# Patient Record
Sex: Female | Born: 1995 | Race: Black or African American | Hispanic: No | Marital: Single | Smoking: Never smoker
Health system: Southern US, Community
[De-identification: ages and names within clinical notes are randomized; demographics above are authoritative.]

## PROBLEM LIST (undated history)

## (undated) DIAGNOSIS — F419 Anxiety disorder, unspecified: Secondary | ICD-10-CM

## (undated) DIAGNOSIS — E119 Type 2 diabetes mellitus without complications: Secondary | ICD-10-CM

## (undated) DIAGNOSIS — E669 Obesity, unspecified: Secondary | ICD-10-CM

## (undated) DIAGNOSIS — F909 Attention-deficit hyperactivity disorder, unspecified type: Secondary | ICD-10-CM

## (undated) DIAGNOSIS — F32A Depression, unspecified: Secondary | ICD-10-CM

## (undated) DIAGNOSIS — R7303 Prediabetes: Secondary | ICD-10-CM

## (undated) DIAGNOSIS — F5105 Insomnia due to other mental disorder: Secondary | ICD-10-CM

## (undated) HISTORY — DX: Prediabetes: R73.03

## (undated) HISTORY — DX: Obesity, unspecified: E66.9

## (undated) HISTORY — DX: Insomnia due to other mental disorder: F51.05

## (undated) HISTORY — DX: Depression, unspecified: F32.A

---
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
## 2017-07-10 ENCOUNTER — Ambulatory Visit: Payer: Self-pay | Admitting: Sports Medicine

## 2018-01-07 ENCOUNTER — Encounter (HOSPITAL_COMMUNITY): Payer: Self-pay | Admitting: Emergency Medicine

## 2018-01-07 ENCOUNTER — Emergency Department (HOSPITAL_COMMUNITY)
Admission: EM | Admit: 2018-01-07 | Discharge: 2018-01-08 | Disposition: A | Discharge status: Home / Self Care | Payer: Self-pay | Attending: Emergency Medicine | Admitting: Emergency Medicine

## 2018-01-07 ENCOUNTER — Emergency Department (HOSPITAL_COMMUNITY): Payer: Self-pay

## 2018-01-07 DIAGNOSIS — R0602 Shortness of breath: Secondary | ICD-10-CM | POA: Insufficient documentation

## 2018-01-07 DIAGNOSIS — F419 Anxiety disorder, unspecified: Secondary | ICD-10-CM | POA: Insufficient documentation

## 2018-01-07 LAB — BASIC METABOLIC PANEL
Anion gap: 7 (ref 5–15)
BUN: 11 mg/dL (ref 6–20)
CALCIUM: 9.5 mg/dL (ref 8.9–10.3)
CO2: 26 mmol/L (ref 22–32)
CREATININE: 0.58 mg/dL (ref 0.44–1.00)
Chloride: 105 mmol/L (ref 101–111)
GFR calc Af Amer: >60 mL/min (ref >60)
GFR calc non Af Amer: >60 mL/min (ref >60)
Glucose, Bld: 98 mg/dL (ref 65–99)
Potassium: 3.6 mmol/L (ref 3.5–5.1)
Sodium: 138 mmol/L (ref 135–145)

## 2018-01-07 LAB — I-STAT TROPONIN, ED: TROPONIN I, POC: 0 ng/mL (ref 0.00–0.08)

## 2018-01-07 LAB — CBC
HCT: 42.1 % (ref 36.0–46.0)
Hemoglobin: 13.9 g/dL (ref 12.0–15.0)
MCH: 28.1 pg (ref 26.0–34.0)
MCHC: 33 g/dL (ref 30.0–36.0)
MCV: 85.2 fL (ref 78.0–100.0)
Platelets: 354 10*3/uL (ref 150–400)
RBC: 4.94 MIL/uL (ref 3.87–5.11)
RDW: 13.6 % (ref 11.5–15.5)
WBC: 7.9 10*3/uL (ref 4.0–10.5)

## 2018-01-07 LAB — I-STAT BETA HCG BLOOD, ED (MC, WL, AP ONLY): I-stat hCG, quantitative: <5 m[IU]/mL (ref <5)

## 2018-01-07 NOTE — ED Notes (Signed)
Family reports patient is in the restroom.

## 2018-01-07 NOTE — Discharge Instructions (Signed)
We saw you in the ER for the chest tightness. All the results in the ER are normal, labs and imaging.  The workup in the ER is not complete, and is limited to screening for life threatening and emergent conditions only, so please see a primary care doctor for further evaluation.  Please follow-up with your primary care doctor for optimal management of your anxiety.

## 2018-01-07 NOTE — ED Triage Notes (Signed)
Patient c/o intermittent central sharp chest pain sudden onset this afternoon. Denies pain radiation, N/V. Reports hx anxiety. States "it may be related to my anxiety."

## 2018-01-08 NOTE — ED Provider Notes (Signed)
[facility]-EMERGENCY DEPT Provider Note   CSN: [id number] Arrival date & time: 01/07/18  1706     History   Chief Complaint Chief Complaint  Patient presents with  . Chest Pain  . Anxiety    HPI Claire Chase is a 22 y.o. female.  HPI 22 year old female comes in with chief complaint of chest pain and shortness of breath.  Patient reports that she has history of anxiety, and intermittently will get chest tightness and shortness of breath.  Patient reports that today she was getting ready to get to work when she started having sudden onset chest tightness along with dyspnea.  Patient denies any associated nausea, vomiting, dizziness, diaphoresis.  Her symptoms lasted longer than usual, therefore she decided to come to the ER.  At one point patient was taking 2 medications for anxiety, however she stopped them because she did not feel like they are helping her.  Pt has no hx of PE, DVT and denies any exogenous hormone (testosterone / estrogen) use, long distance travels or surgery in the past 6 weeks, active cancer, recent immobilization.  Patient also denies any history of heavy smoking or substance abuse.   Past Medical History:  Diagnosis Date  . ADHD (attention deficit hyperactivity disorder)     There are no active problems to display for this patient.   History reviewed. No pertinent surgical history.  OB History    No data available       Home Medications    Prior to Admission medications   Medication Sig Start Date End Date Taking? Authorizing Provider  naproxen (NAPROSYN) 500 MG tablet Take 1 tablet (500 mg total) by mouth 2 (two) times daily. 11/06/14   Derwood Kaplan, MD    Family History No family history on file.  Social History Social History   Tobacco Use  . Smoking status: Never Smoker  Substance Use Topics  . Alcohol use: No  . Drug use: No     Allergies   Patient has no known allergies.   Review of Systems Review  of Systems  Respiratory: Positive for chest tightness and shortness of breath.   Hematological: Does not bruise/bleed easily.     Physical Exam Updated Vital Signs BP 115/87 (BP Location: Left Arm)   Pulse 84   Temp 98.4 F (36.9 C) (Oral)   Resp 18   LMP 12/13/2017   SpO2 100%   Physical Exam  Constitutional: She is oriented to person, place, and time. She appears well-developed.  HENT:  Head: Normocephalic and atraumatic.  Eyes: EOM are normal.  Neck: Normal range of motion. Neck supple.  Cardiovascular: Normal rate.  Pulmonary/Chest: Effort normal. No respiratory distress.  Abdominal: Bowel sounds are normal.  Neurological: She is alert and oriented to person, place, and time.  Skin: Skin is warm and dry.  Nursing note and vitals reviewed.    ED Treatments / Results  Labs (all labs ordered are listed, but only abnormal results are displayed) Labs Reviewed  BASIC METABOLIC PANEL  CBC  I-STAT TROPONIN, ED  I-STAT BETA HCG BLOOD, ED ([facility], [facility], [facility] ONLY)    EKG  EKG Interpretation  Date/Time:  Monday January 07 2018 17:23:56 EST Ventricular Rate:  88 PR Interval:    QRS Duration: 95 QT Interval:  359 QTC Calculation: 435 R Axis:   43 Text Interpretation:  Sinus rhythm No acute changes No significant change since last tracing Confirmed by Derwood Kaplan [phone]) on 01/07/2018 11:04:27 PM  Radiology Dg Chest 2 View  Result Date: 01/07/2018 CLINICAL DATA:  Chest pain RIGHT upper chest with shortness of breath and dry cough EXAM: CHEST  2 VIEW COMPARISON:  None FINDINGS: Enlargement of cardiac silhouette. Mediastinal contours and pulmonary vascularity normal. Low lung volumes with mild RIGHT basilar atelectasis. Lungs otherwise clear. No acute infiltrate, pleural effusion or pneumothorax. IMPRESSION: Enlargement of cardiac silhouette with mild RIGHT basilar atelectasis. Electronically Signed   By: Ulyses SouthwardMark  Boles M.D.   On: 01/07/2018 17:47     Procedures Procedures (including critical care time)  Medications Ordered in ED Medications - No data to display   Initial Impression / Assessment and Plan / ED Course  I have reviewed the triage vital signs and the nursing notes.  Pertinent labs & imaging results that were available during my care of the patient were reviewed by me and considered in my medical decision making (see chart for details).     Patient comes in with chief complaint of chest pain and dyspnea.  Patient is asymptomatic at this time.  She reports that she has had intermittent episodes of chest pain and tightness throughout her life and used to take medications for anxiety which she has stopped.  There was no specific evoking factor for her chest pain.  Patient's past medical history, family history and social history of are unremarkable and reassuring.  Labs in the ER and EKG are also reassuring.  We will advised patient to follow-up with her primary care doctor and see if she needs to get appropriately managed for her anxiety disorder.  Final Clinical Impressions(s) / ED Diagnoses   Final diagnoses:  Acute anxiety    ED Discharge Orders    None       Derwood KaplanNanavati, Tajh Livsey, MD 01/08/18 [phone]

## 2018-12-07 ENCOUNTER — Encounter (HOSPITAL_COMMUNITY): Payer: Self-pay | Admitting: Emergency Medicine

## 2018-12-07 ENCOUNTER — Emergency Department (HOSPITAL_COMMUNITY)
Admission: EM | Admit: 2018-12-07 | Discharge: 2018-12-07 | Disposition: A | Discharge status: Home / Self Care | Payer: Self-pay | Attending: Emergency Medicine | Admitting: Emergency Medicine

## 2018-12-07 ENCOUNTER — Other Ambulatory Visit: Payer: Self-pay

## 2018-12-07 DIAGNOSIS — R110 Nausea: Secondary | ICD-10-CM | POA: Insufficient documentation

## 2018-12-07 DIAGNOSIS — R197 Diarrhea, unspecified: Secondary | ICD-10-CM | POA: Insufficient documentation

## 2018-12-07 DIAGNOSIS — R1084 Generalized abdominal pain: Secondary | ICD-10-CM | POA: Insufficient documentation

## 2018-12-07 LAB — COMPREHENSIVE METABOLIC PANEL
ALT: 32 U/L (ref 0–44)
AST: 23 U/L (ref 15–41)
Albumin: 4.3 g/dL (ref 3.5–5.0)
Alkaline Phosphatase: 70 U/L (ref 38–126)
Anion gap: 12 (ref 5–15)
BUN: 12 mg/dL (ref 6–20)
CO2: 26 mmol/L (ref 22–32)
Calcium: 9.2 mg/dL (ref 8.9–10.3)
Chloride: 101 mmol/L (ref 98–111)
Creatinine, Ser: 0.61 mg/dL (ref 0.44–1.00)
GFR calc Af Amer: >60 mL/min (ref >60)
Glucose, Bld: 93 mg/dL (ref 70–99)
Potassium: 3.8 mmol/L (ref 3.5–5.1)
Sodium: 139 mmol/L (ref 135–145)
Total Bilirubin: 0.4 mg/dL (ref 0.3–1.2)
Total Protein: 8.2 g/dL — ABNORMAL HIGH (ref 6.5–8.1)

## 2018-12-07 LAB — CBC
HCT: 43.5 % (ref 36.0–46.0)
Hemoglobin: 13.4 g/dL (ref 12.0–15.0)
MCH: 28 pg (ref 26.0–34.0)
MCHC: 30.8 g/dL (ref 30.0–36.0)
MCV: 90.8 fL (ref 80.0–100.0)
Platelets: 318 10*3/uL (ref 150–400)
RBC: 4.79 MIL/uL (ref 3.87–5.11)
RDW: 13.4 % (ref 11.5–15.5)
WBC: 8.3 10*3/uL (ref 4.0–10.5)
nRBC: 0 % (ref 0.0–0.2)

## 2018-12-07 LAB — I-STAT BETA HCG BLOOD, ED (MC, WL, AP ONLY): I-stat hCG, quantitative: <5 m[IU]/mL (ref <5)

## 2018-12-07 LAB — LIPASE, BLOOD: Lipase: 29 U/L (ref 11–51)

## 2018-12-07 MED ORDER — ONDANSETRON 4 MG PO TBDP: 4.0000 mg | ORAL_TABLET | Freq: Three times a day (TID) | ORAL | 0 refills | Status: DC | PRN

## 2018-12-07 MED ORDER — DICYCLOMINE HCL 20 MG PO TABS: 20.0000 mg | ORAL_TABLET | Freq: Two times a day (BID) | ORAL | 0 refills | Status: DC | PRN

## 2018-12-07 MED ORDER — DICYCLOMINE HCL 10 MG PO CAPS
10.0000 mg | ORAL_CAPSULE | Freq: Once | ORAL | Status: AC
  Administered 2018-12-07: 10 mg via ORAL
  Filled 2018-12-07: qty 1

## 2018-12-07 MED ORDER — SODIUM CHLORIDE 0.9 % IV BOLUS: 1000.0000 mL | Freq: Once | INTRAVENOUS | Status: DC

## 2018-12-07 MED ORDER — ONDANSETRON 4 MG PO TBDP
4.0000 mg | ORAL_TABLET | Freq: Once | ORAL | Status: AC
  Administered 2018-12-07: 4 mg via ORAL
  Filled 2018-12-07: qty 1

## 2018-12-07 MED ORDER — KETOROLAC TROMETHAMINE 30 MG/ML IJ SOLN
15.0000 mg | Freq: Once | INTRAMUSCULAR | Status: DC
  Filled 2018-12-07: qty 1

## 2018-12-07 MED ORDER — ONDANSETRON HCL 4 MG/2ML IJ SOLN
4.0000 mg | Freq: Once | INTRAMUSCULAR | Status: DC
  Filled 2018-12-07: qty 2

## 2018-12-07 NOTE — ED Triage Notes (Signed)
Pt c/o nausea, abdominal cramping and diarrhea since this morning. Pt  With one episode of diarrhea and persistent nausea.

## 2018-12-07 NOTE — Discharge Instructions (Signed)
Follow up with your primary care doctor to discuss your hospital visit. Continue to hydrate orally with small sips of fluids throughout the day. Use Zofran as directed for nausea & vomiting. Bentyl as needed for abdominal cramping.   SEEK IMMEDIATE MEDICAL ATTENTION IF: You begin having localized abdominal pain that does not go away or becomes severe. For example, instead of your stomach hurting all over, it now just hurts in the bottom right.  A temperature above 100 develops Repeated vomiting occurs (multiple uncontrollable episodes) or you are unable to keep fluids down Blood is being passed in stools or vomit (bright red or black tarry stools).  New or worsening symptoms develop, you have any additional concerns.

## 2018-12-07 NOTE — ED Provider Notes (Signed)
[facility]-EMERGENCY DEPT Provider Note   CSN: [id number] Arrival date & time: 12/07/18  1718     History   Chief Complaint Chief Complaint  Patient presents with  . Abdominal Pain  . Diarrhea  . Nausea    HPI Claire Chase is a 22 y.o. female.  The history is provided by the patient and medical records. No language interpreter was used.  Abdominal Pain   Associated symptoms include diarrhea and nausea. Pertinent negatives include vomiting.  Diarrhea   Associated symptoms include abdominal pain. Pertinent negatives include no vomiting.   Claire Chase is a 22 y.o. female with no pertinent past medical history presents to the emergency department for abdominal pain, nausea and diarrhea which began this morning.  Patient states that her boyfriend's sister was throwing up while she was over there yesterday.  She thought she had a stomach bug.  She felt fine throughout the day yesterday, however this morning became very nauseous and flushed.  She has felt like she was going to throw up all day, however has not.  She did have one episode of nonbloody loose stool just prior to my evaluation.  She reports generalized abdominal cramping as well.  No medications taken prior to arrival for symptoms.  Denies any history of similar.  No urinary symptoms, vaginal discharge, fever, chills, chest pain, shortness of breath or back pain.  Past Medical History:  Diagnosis Date  . ADHD (attention deficit hyperactivity disorder)     There are no active problems to display for this patient.   History reviewed. No pertinent surgical history.   OB History   No obstetric history on file.      Home Medications    Prior to Admission medications   Medication Sig Start Date End Date Taking? Authorizing Provider  ibuprofen (ADVIL,MOTRIN) 200 MG tablet Take 800 mg by mouth every 6 (six) hours as needed for headache or moderate pain.   Yes [provider]  dicyclomine  (BENTYL) 20 MG tablet Take 1 tablet (20 mg total) by mouth 2 (two) times daily as needed (Abdominal cramping). 12/07/18   , Chase PicketJaime Pilcher, PA-C  naproxen (NAPROSYN) 500 MG tablet Take 1 tablet (500 mg total) by mouth 2 (two) times daily. Patient not taking: Reported on 12/07/2018 11/06/14   Derwood KaplanNanavati, Ankit, MD  ondansetron (ZOFRAN ODT) 4 MG disintegrating tablet Take 1 tablet (4 mg total) by mouth every 8 (eight) hours as needed for nausea or vomiting. 12/07/18   , Chase PicketJaime Pilcher, PA-C    Family History History reviewed. No pertinent family history.  Social History Social History   Tobacco Use  . Smoking status: Never Smoker  . Smokeless tobacco: Never Used  Substance Use Topics  . Alcohol use: No  . Drug use: No     Allergies   Patient has no known allergies.   Review of Systems Review of Systems  Gastrointestinal: Positive for abdominal pain, diarrhea and nausea. Negative for blood in stool and vomiting.  All other systems reviewed and are negative.    Physical Exam Updated Vital Signs BP 128/76   Pulse 94   Temp 100.1 F (37.8 C) (Oral)   Resp 18   LMP 11/19/2018 (Approximate)   SpO2 96%   Physical Exam Vitals signs and nursing note reviewed.  Constitutional:      General: She is not in acute distress.    Appearance: She is well-developed.     Comments: Non-toxic appearing.   HENT:  Head: Normocephalic and atraumatic.  Cardiovascular:     Rate and Rhythm: Normal rate and regular rhythm.     Heart sounds: Normal heart sounds. No murmur.  Pulmonary:     Effort: Pulmonary effort is normal. No respiratory distress.     Breath sounds: Normal breath sounds.  Abdominal:     General: There is no distension.     Palpations: Abdomen is soft.     Comments: Mild generalized abdominal tenderness without focality.  No rebound or guarding.  Negative Murphy's.  No point tenderness at McBurney's.  No CVA tenderness.  Skin:    General: Skin is warm and dry.    Neurological:     Mental Status: She is alert and oriented to person, place, and time.      ED Treatments / Results  Labs (all labs ordered are listed, but only abnormal results are displayed) Labs Reviewed  COMPREHENSIVE METABOLIC PANEL - Abnormal; Notable for the following components:      Result Value   Total Protein 8.2 (*)    All other components within normal limits  LIPASE, BLOOD  CBC  URINALYSIS, ROUTINE W REFLEX MICROSCOPIC  I-STAT BETA HCG BLOOD, ED ([facility], [facility], [facility] ONLY)    EKG None  Radiology No results found.  Procedures Procedures (including critical care time)  Medications Ordered in ED Medications  dicyclomine (BENTYL) capsule 10 mg (10 mg Oral Given 12/07/18 1944)  ondansetron (ZOFRAN-ODT) disintegrating tablet 4 mg (4 mg Oral Given 12/07/18 2040)     Initial Impression / Assessment and Plan / ED Course  I have reviewed the triage vital signs and the nursing notes.  Pertinent labs & imaging results that were available during my care of the patient were reviewed by me and considered in my medical decision making (see chart for details).    Claire Chase is a 22 y.o. female who presents to ED for nausea, abdominal cramping and diarrhea which began today. Boyfriend's sister with similar sxs yesterday. On exam, patient is afebrile, hemodynamically stable with mild generalized abdominal tenderness without focality. No peritoneal signs. Labs reviewed and reassuring. Patient re-evaluated. Repeat abdominal exam non-surgical. Sxs c/w viral etiology. Evaluation does not show pathology that would require ongoing emergent intervention or inpatient treatment. Rx for Bentyl, Zofran given. PCP follow up encouraged. Spoke at length with patient about signs or symptoms that should prompt return to emergency Department including inability to tolerate PO, blood in the stools, focal localization of abdominal pain, new/worsening symptoms or any additional concerns. Patient understands  diagnosis and plan of care as dictated above. All questions answered.   Final Clinical Impressions(s) / ED Diagnoses   Final diagnoses:  Nausea  Diarrhea of presumed infectious origin    ED Discharge Orders         Ordered    ondansetron (ZOFRAN ODT) 4 MG disintegrating tablet  Every 8 hours PRN     12/07/18 2119    dicyclomine (BENTYL) 20 MG tablet  2 times daily PRN     12/07/18 2119           , Chase PicketJaime Pilcher, PA-C 12/07/18 2203    Mancel BaleWentz, Elliott, MD 12/08/18 1539

## 2018-12-08 ENCOUNTER — Other Ambulatory Visit: Payer: Self-pay

## 2018-12-08 ENCOUNTER — Encounter (HOSPITAL_COMMUNITY): Payer: Self-pay | Admitting: Emergency Medicine

## 2018-12-08 ENCOUNTER — Emergency Department (HOSPITAL_COMMUNITY): Payer: Self-pay

## 2018-12-08 ENCOUNTER — Emergency Department (HOSPITAL_COMMUNITY)
Admission: EM | Admit: 2018-12-08 | Discharge: 2018-12-08 | Disposition: A | Discharge status: Home / Self Care | Payer: Self-pay | Attending: Emergency Medicine | Admitting: Emergency Medicine

## 2018-12-08 DIAGNOSIS — K219 Gastro-esophageal reflux disease without esophagitis: Secondary | ICD-10-CM | POA: Insufficient documentation

## 2018-12-08 DIAGNOSIS — N390 Urinary tract infection, site not specified: Secondary | ICD-10-CM | POA: Insufficient documentation

## 2018-12-08 LAB — URINALYSIS, ROUTINE W REFLEX MICROSCOPIC
Bilirubin Urine: NEGATIVE
Glucose, UA: NEGATIVE mg/dL
Ketones, ur: NEGATIVE mg/dL
Nitrite: NEGATIVE
PH: 6 (ref 5.0–8.0)
Protein, ur: NEGATIVE mg/dL
Specific Gravity, Urine: 1.005 (ref 1.005–1.030)

## 2018-12-08 MED ORDER — ACETAMINOPHEN 500 MG PO TABS
1000.0000 mg | ORAL_TABLET | Freq: Once | ORAL | Status: AC
  Administered 2018-12-08: 1000 mg via ORAL
  Filled 2018-12-08: qty 2

## 2018-12-08 MED ORDER — ALUM & MAG HYDROXIDE-SIMETH 200-200-20 MG/5ML PO SUSP
30.0000 mL | Freq: Once | ORAL | Status: AC
  Administered 2018-12-08: 30 mL via ORAL
  Filled 2018-12-08: qty 30

## 2018-12-08 MED ORDER — CEPHALEXIN 500 MG PO CAPS: 500.0000 mg | ORAL_CAPSULE | Freq: Two times a day (BID) | ORAL | 0 refills | Status: DC

## 2018-12-08 MED ORDER — CEPHALEXIN 500 MG PO CAPS
500.0000 mg | ORAL_CAPSULE | Freq: Once | ORAL | Status: AC
  Administered 2018-12-08: 500 mg via ORAL
  Filled 2018-12-08: qty 1

## 2018-12-08 NOTE — ED Provider Notes (Signed)
[facility]-EMERGENCY DEPT Provider Note   CSN: [id number] Arrival date & time: 12/08/18  2048     History   Chief Complaint Chief Complaint  Patient presents with  . Chest Pain    HPI Claire Chase is a 22 y.o. female.  Patient is a 22 year old female with no significant past medical history who presents with chest pain.  She was seen here yesterday for nausea and vomiting.  She states she is no longer having any vomiting.  She is had a little bit of diarrhea.  No abdominal pain.  She states that she woke up this afternoon with some discomfort which she describes as a burning pain to the center of her chest.  It is nonradiating.  No associated shortness of breath.  No current nausea.  No diaphoresis.  It is nonpleuritic.  Not worse with movement.  She said when she ate a little chicken broth that seem to make it better.  She was noted to have a temperature of 100.5 in triage but she does not report any known fevers.  No URI symptoms.  No urinary symptoms.  No cough or chest congestion.  No dizziness.  She denies any leg pain or swelling.  She is not on oral estrogens.  She denies any recent immobilization.  No history of blood clots.  She has no tobacco use.     Past Medical History:  Diagnosis Date  . ADHD (attention deficit hyperactivity disorder)     There are no active problems to display for this patient.   History reviewed. No pertinent surgical history.   OB History   No obstetric history on file.      Home Medications    Prior to Admission medications   Medication Sig Start Date End Date Taking? Authorizing Provider  dicyclomine (BENTYL) 20 MG tablet Take 1 tablet (20 mg total) by mouth 2 (two) times daily as needed (Abdominal cramping). 12/07/18  Yes Ward, Chase PicketJaime Pilcher, PA-C  ondansetron (ZOFRAN ODT) 4 MG disintegrating tablet Take 1 tablet (4 mg total) by mouth every 8 (eight) hours as needed for nausea or vomiting. 12/07/18  Yes Ward, Chase PicketJaime  Pilcher, PA-C  cephALEXin (KEFLEX) 500 MG capsule Take 1 capsule (500 mg total) by mouth 2 (two) times daily. 12/08/18   Rolan BuccoBelfi, Ayaana Biondo, MD  ibuprofen (ADVIL,MOTRIN) 200 MG tablet Take 800 mg by mouth every 6 (six) hours as needed for headache or moderate pain.    [provider]  naproxen (NAPROSYN) 500 MG tablet Take 1 tablet (500 mg total) by mouth 2 (two) times daily. Patient not taking: Reported on 12/07/2018 11/06/14   Derwood KaplanNanavati, Ankit, MD    Family History History reviewed. No pertinent family history.  Social History Social History   Tobacco Use  . Smoking status: Never Smoker  . Smokeless tobacco: Never Used  Substance Use Topics  . Alcohol use: No  . Drug use: No     Allergies   Patient has no known allergies.   Review of Systems Review of Systems  Constitutional: Positive for fever. Negative for chills, diaphoresis and fatigue.  HENT: Negative for congestion, rhinorrhea and sneezing.   Eyes: Negative.   Respiratory: Negative for cough, chest tightness and shortness of breath.   Cardiovascular: Positive for chest pain. Negative for leg swelling.  Gastrointestinal: Negative for abdominal pain, blood in stool, diarrhea, nausea and vomiting.  Genitourinary: Negative for difficulty urinating, flank pain, frequency and hematuria.  Musculoskeletal: Negative for arthralgias and back pain.  Skin: Negative for rash.  Neurological: Negative for dizziness, speech difficulty, weakness, numbness and headaches.     Physical Exam Updated Vital Signs BP 124/89 (BP Location: Left Arm)   Pulse (!) 104   Temp (!) 100.5 F (38.1 C) (Oral)   Resp (!) 22   Ht 5\' 8"  (1.727 m)   Wt 136.1 kg   LMP 11/19/2018 (Approximate)   SpO2 98%   BMI 45.61 kg/m   Physical Exam Constitutional:      Appearance: She is well-developed.  HENT:     Head: Normocephalic and atraumatic.     Nose: No congestion.     Mouth/Throat:     Mouth: Mucous membranes are moist.     Pharynx:  No oropharyngeal exudate or posterior oropharyngeal erythema.  Eyes:     Pupils: Pupils are equal, round, and reactive to light.  Neck:     Musculoskeletal: Normal range of motion and neck supple.  Cardiovascular:     Rate and Rhythm: Normal rate and regular rhythm.     Heart sounds: Normal heart sounds.  Pulmonary:     Effort: Pulmonary effort is normal. No respiratory distress.     Breath sounds: Normal breath sounds. No wheezing or rales.  Chest:     Chest wall: No tenderness.  Abdominal:     General: Bowel sounds are normal.     Palpations: Abdomen is soft.     Tenderness: There is no abdominal tenderness. There is no guarding or rebound.  Musculoskeletal: Normal range of motion.     Comments: No edema or calf tenderness  Lymphadenopathy:     Cervical: No cervical adenopathy.  Skin:    General: Skin is warm and dry.     Findings: No rash.  Neurological:     Mental Status: She is alert and oriented to person, place, and time.      ED Treatments / Results  Labs (all labs ordered are listed, but only abnormal results are displayed) Labs Reviewed  URINALYSIS, ROUTINE W REFLEX MICROSCOPIC - Abnormal; Notable for the following components:      Result Value   Hgb urine dipstick SMALL (*)    Leukocytes, UA LARGE (*)    Bacteria, UA RARE (*)    All other components within normal limits    EKG None  Radiology Dg Chest 2 View  Result Date: 12/08/2018 CLINICAL DATA:  Chest pain for several hours EXAM: CHEST - 2 VIEW COMPARISON:  01/07/2018 FINDINGS: The heart size and mediastinal contours are within normal limits. Both lungs are clear. The visualized skeletal structures are unremarkable. IMPRESSION: No active cardiopulmonary disease. Electronically Signed   By: Alcide Clever M.D.   On: 12/08/2018 21:27    Procedures Procedures (including critical care time)  Medications Ordered in ED Medications  cephALEXin (KEFLEX) capsule 500 mg (has no administration in time range)    alum & mag hydroxide-simeth (MAALOX/MYLANTA) 200-200-20 MG/5ML suspension 30 mL (30 mLs Oral Given 12/08/18 2137)  acetaminophen (TYLENOL) tablet 1,000 mg (1,000 mg Oral Given 12/08/18 2137)     Initial Impression / Assessment and Plan / ED Course  I have reviewed the triage vital signs and the nursing notes.  Pertinent labs & imaging results that were available during my care of the patient were reviewed by me and considered in my medical decision making (see chart for details).     Patient is a 22 year old female who presents with chest pain.  I feel this is related to reflux from  her recent vomiting.  Her symptoms completely resolved after Maalox.  Her chest x-ray is clear without evidence of pneumonia.  She does have a low-grade fever.  She likely has a viral syndrome but does have evidence of a urinary tract infection.  I will start her on Keflex.  She has no suggestions of pyelonephritis.  Her abdomen is nontender.  She has no back tenderness.  Her labs from yesterday were non-concerning.  She was discharged home in good condition.  She was given prescriptions for Keflex.  She was advised to use Tums and Pepcid for her GERD.  She was advised that she had some blood in her urine and at some point this needs to be followed up by primary care physician to make sure the blood clears.  It is likely related to infection.  She was given return precautions.  Final Clinical Impressions(s) / ED Diagnoses   Final diagnoses:  Urinary tract infection without hematuria, site unspecified  Gastroesophageal reflux disease without esophagitis    ED Discharge Orders         Ordered    cephALEXin (KEFLEX) 500 MG capsule  2 times daily     12/08/18 2300           Rolan BuccoBelfi, Abelina Ketron, MD 12/08/18 2302

## 2018-12-08 NOTE — ED Triage Notes (Signed)
Patient is having mid chest pain. Patient said it started today. She thinks it is a side effect from medication given last night.

## 2018-12-08 NOTE — ED Notes (Signed)
Patient transported to X-ray 

## 2018-12-08 NOTE — Discharge Instructions (Addendum)
Your chest pain is likely related to reflux.  You should take some over-the-counter Pepcid or Tums to help with the symptoms.  You do have evidence of a urinary tract infection.  I have given you a prescription for antibiotics.  Complete the whole course of antibiotics.  He did have some blood in your urine which is likely related to the infection.  When your symptoms have resolved, you should at some point have your urine rechecked to make sure that the blood clears.  Return to the emergency room if you have any worsening abdominal pain, fevers, vomiting or other worsening symptoms.

## 2021-07-19 ENCOUNTER — Other Ambulatory Visit: Payer: Self-pay

## 2021-07-19 ENCOUNTER — Emergency Department (HOSPITAL_BASED_OUTPATIENT_CLINIC_OR_DEPARTMENT_OTHER): Payer: Self-pay

## 2021-07-19 ENCOUNTER — Encounter (HOSPITAL_BASED_OUTPATIENT_CLINIC_OR_DEPARTMENT_OTHER): Payer: Self-pay | Admitting: Emergency Medicine

## 2021-07-19 ENCOUNTER — Emergency Department (HOSPITAL_BASED_OUTPATIENT_CLINIC_OR_DEPARTMENT_OTHER)
Admission: EM | Admit: 2021-07-19 | Discharge: 2021-07-19 | Disposition: A | Discharge status: Home / Self Care | Payer: Self-pay | Attending: Emergency Medicine | Admitting: Emergency Medicine

## 2021-07-19 DIAGNOSIS — R002 Palpitations: Secondary | ICD-10-CM | POA: Insufficient documentation

## 2021-07-19 DIAGNOSIS — E119 Type 2 diabetes mellitus without complications: Secondary | ICD-10-CM | POA: Insufficient documentation

## 2021-07-19 DIAGNOSIS — Z20822 Contact with and (suspected) exposure to covid-19: Secondary | ICD-10-CM | POA: Insufficient documentation

## 2021-07-19 HISTORY — DX: Anxiety disorder, unspecified: F41.9

## 2021-07-19 HISTORY — DX: Type 2 diabetes mellitus without complications: E11.9

## 2021-07-19 LAB — RESP PANEL BY RT-PCR (FLU A&B, COVID) ARPGX2
Influenza A by PCR: NEGATIVE
Influenza B by PCR: NEGATIVE
SARS Coronavirus 2 by RT PCR: NEGATIVE

## 2021-07-19 NOTE — ED Provider Notes (Signed)
[facility] EMERGENCY DEPT Provider Note   CSN: [id number] Arrival date & time: 07/19/21  2117     History Chief Complaint  Patient presents with   Palpitations    Claire Chase is a 25 y.o. female.  The history is provided by the patient.  Palpitations Palpitations quality:  Fast Onset quality:  Sudden Timing:  Intermittent Progression:  Waxing and waning Chronicity:  Recurrent Context: anxiety   Relieved by:  Nothing Worsened by:  Nothing Associated symptoms: no back pain, no chest pain, no cough, no shortness of breath and no vomiting       Past Medical History:  Diagnosis Date   ADHD (attention deficit hyperactivity disorder)    Anxiety    Diabetes mellitus without complication (HCC)     There are no problems to display for this patient.   No past surgical history on file.   OB History   No obstetric history on file.     No family history on file.  Social History   Tobacco Use   Smoking status: Never   Smokeless tobacco: Never  Vaping Use   Vaping Use: Never used  Substance Use Topics   Alcohol use: Yes    Comment: occasionally   Drug use: No    Home Medications Prior to Admission medications   Medication Sig Start Date End Date Taking? Authorizing Provider  cephALEXin (KEFLEX) 500 MG capsule Take 1 capsule (500 mg total) by mouth 2 (two) times daily. 12/08/18   Rolan Bucco, MD  dicyclomine (BENTYL) 20 MG tablet Take 1 tablet (20 mg total) by mouth 2 (two) times daily as needed (Abdominal cramping). 12/07/18   Ward, Chase Picket, PA-C  ibuprofen (ADVIL,MOTRIN) 200 MG tablet Take 800 mg by mouth every 6 (six) hours as needed for headache or moderate pain.    [provider]  naproxen (NAPROSYN) 500 MG tablet Take 1 tablet (500 mg total) by mouth 2 (two) times daily. Patient not taking: Reported on 12/07/2018 11/06/14   Derwood Kaplan, MD  ondansetron (ZOFRAN ODT) 4 MG disintegrating tablet Take 1 tablet (4 mg total) by  mouth every 8 (eight) hours as needed for nausea or vomiting. 12/07/18   Ward, Chase Picket, PA-C    Allergies    Patient has no allergy information on record.  Review of Systems   Review of Systems  Constitutional:  Negative for chills and fever.  HENT:  Negative for ear pain and sore throat.   Eyes:  Negative for pain and visual disturbance.  Respiratory:  Negative for cough and shortness of breath.   Cardiovascular:  Positive for palpitations. Negative for chest pain.  Gastrointestinal:  Negative for abdominal pain and vomiting.  Genitourinary:  Negative for dysuria and hematuria.  Musculoskeletal:  Positive for myalgias. Negative for arthralgias and back pain.  Skin:  Negative for color change and rash.  Neurological:  Negative for seizures and syncope.  All other systems reviewed and are negative.  Physical Exam Updated Vital Signs BP 117/78 (BP Location: Right Arm)   Pulse 70   Temp 98.7 F (37.1 C) (Oral)   Resp 18   Ht 5\' 8"  (1.727 m)   Wt 136.1 kg   LMP 07/05/2021 (Approximate)   SpO2 100%   BMI 45.61 kg/m   Physical Exam Vitals and nursing note reviewed.  Constitutional:      General: She is not in acute distress.    Appearance: She is well-developed. She is not ill-appearing.  HENT:  Head: Normocephalic and atraumatic.  Eyes:     Extraocular Movements: Extraocular movements intact.     Conjunctiva/sclera: Conjunctivae normal.     Pupils: Pupils are equal, round, and reactive to light.  Cardiovascular:     Rate and Rhythm: Normal rate and regular rhythm.     Pulses: Normal pulses.     Heart sounds: Normal heart sounds. No murmur heard. Pulmonary:     Effort: Pulmonary effort is normal. No respiratory distress.     Breath sounds: Normal breath sounds.  Abdominal:     Palpations: Abdomen is soft.     Tenderness: There is no abdominal tenderness.  Musculoskeletal:     Cervical back: Normal range of motion and neck supple. No rigidity.  Skin:     General: Skin is warm and dry.     Capillary Refill: Capillary refill takes less than 2 seconds.  Neurological:     General: No focal deficit present.     Mental Status: She is alert and oriented to person, place, and time.     Cranial Nerves: No cranial nerve deficit.     Sensory: No sensory deficit.     Motor: No weakness.     Coordination: Coordination normal.    ED Results / Procedures / Treatments   Labs (all labs ordered are listed, but only abnormal results are displayed) Labs Reviewed  RESP PANEL BY RT-PCR (FLU A&B, COVID) [id number]    EKG EKG Interpretation  Date/Time:  Tuesday July 19 2021 21:42:02 EDT Ventricular Rate:  69 PR Interval:  156 QRS Duration: 106 QT Interval:  391 QTC Calculation: 419 R Axis:   39 Text Interpretation: Sinus arrhythmia Confirmed by Virgina Norfolk ([id number]) on 07/19/2021 9:45:24 PM  Radiology DG Chest Portable 1 View  Result Date: 07/19/2021 CLINICAL DATA:  Palpitations. EXAM: PORTABLE CHEST 1 VIEW COMPARISON:  10/08/2018 FINDINGS: Lung volumes are low.The cardiomediastinal contours are normal for technique. Pulmonary vasculature is normal. No consolidation, pleural effusion, or pneumothorax. No acute osseous abnormalities are seen. IMPRESSION: Low lung volumes without acute chest finding. Electronically Signed   By: Narda Rutherford M.D.   On: 07/19/2021 22:05    Procedures Procedures   Medications Ordered in ED Medications - No data to display  ED Course  I have reviewed the triage vital signs and the nursing notes.  Pertinent labs & imaging results that were available during my care of the patient were reviewed by me and considered in my medical decision making (see chart for details).    MDM Rules/Calculators/A&P                           Claire Chase is here for palpitations.  EKG shows sinus rhythm.  No ischemic changes.  Normal vitals.  No fever.  States may be sore throat at times, palpitations, discomfort in her upper chest, neck.   Overall no signs of throat infection on exam.  She appears very well.  She thinks that this could be secondary to anxiety and overall suspect the same.  Chest x-ray showed no evidence of pneumonia.  EKG showed no ischemic changes or abnormal heart rhythm.  Given reassurance and recommend follow-up with primary care doctor symptoms are persistent.  Discharged in good condition.  This chart was dictated using voice recognition software.  Despite best efforts to proofread,  errors can occur which can change the documentation meaning.   Final Clinical Impression(s) / ED Diagnoses Final diagnoses:  Palpitations  Rx / DC Orders ED Discharge Orders     None        Virgina Norfolk, DO 07/19/21 2208

## 2021-07-19 NOTE — ED Triage Notes (Signed)
Pt via pov from home with palpitations, neck pain, and a feeling "of my throat closing up." Pt states she has hx of anxiety, but this feels stronger than in the past. Denies sob,n/v/d. Pt alert & oriented, nad noted.

## 2021-07-19 NOTE — Discharge Instructions (Addendum)
Work-up today is unremarkable.  Suspect there could be an anxiety component to her symptoms but if palpitations continue please follow-up with your primary care doctor to discuss wearing a cardiac monitor.  Please refrain from caffeine use and continue getting plenty of rest and hydration.  Follow-up COVID test on your MyChart.  This result should be back later tonight.

## 2021-07-25 ENCOUNTER — Other Ambulatory Visit: Payer: Self-pay

## 2021-07-25 ENCOUNTER — Emergency Department (HOSPITAL_BASED_OUTPATIENT_CLINIC_OR_DEPARTMENT_OTHER): Payer: Medicaid Other | Admitting: Radiology

## 2021-07-25 ENCOUNTER — Emergency Department (HOSPITAL_BASED_OUTPATIENT_CLINIC_OR_DEPARTMENT_OTHER)
Admission: EM | Admit: 2021-07-25 | Discharge: 2021-07-26 | Disposition: A | Discharge status: Home / Self Care | Payer: Medicaid Other | Attending: Emergency Medicine | Admitting: Emergency Medicine

## 2021-07-25 ENCOUNTER — Encounter (HOSPITAL_BASED_OUTPATIENT_CLINIC_OR_DEPARTMENT_OTHER): Payer: Self-pay | Admitting: Emergency Medicine

## 2021-07-25 DIAGNOSIS — E119 Type 2 diabetes mellitus without complications: Secondary | ICD-10-CM | POA: Insufficient documentation

## 2021-07-25 DIAGNOSIS — R0789 Other chest pain: Secondary | ICD-10-CM | POA: Insufficient documentation

## 2021-07-25 LAB — TROPONIN I (HIGH SENSITIVITY): Troponin I (High Sensitivity): <2 ng/L (ref <18)

## 2021-07-25 LAB — CBC
HCT: 42.3 % (ref 36.0–46.0)
Hemoglobin: 13.6 g/dL (ref 12.0–15.0)
MCH: 29.1 pg (ref 26.0–34.0)
MCHC: 32.2 g/dL (ref 30.0–36.0)
MCV: 90.6 fL (ref 80.0–100.0)
Platelets: 286 10*3/uL (ref 150–400)
RBC: 4.67 MIL/uL (ref 3.87–5.11)
RDW: 13.2 % (ref 11.5–15.5)
WBC: 6.6 10*3/uL (ref 4.0–10.5)
nRBC: 0 % (ref 0.0–0.2)

## 2021-07-25 LAB — BASIC METABOLIC PANEL
Anion gap: 7 (ref 5–15)
BUN: 12 mg/dL (ref 6–20)
CO2: 29 mmol/L (ref 22–32)
Calcium: 9.1 mg/dL (ref 8.9–10.3)
Chloride: 104 mmol/L (ref 98–111)
Creatinine, Ser: 0.58 mg/dL (ref 0.44–1.00)
GFR, Estimated: >60 mL/min (ref >60)
Glucose, Bld: 95 mg/dL (ref 70–99)
Potassium: 4.2 mmol/L (ref 3.5–5.1)
Sodium: 140 mmol/L (ref 135–145)

## 2021-07-25 MED ORDER — KETOROLAC TROMETHAMINE 15 MG/ML IJ SOLN
15.0000 mg | Freq: Once | INTRAMUSCULAR | Status: AC
  Administered 2021-07-25: 15 mg via INTRAVENOUS
  Filled 2021-07-25: qty 1

## 2021-07-25 NOTE — ED Triage Notes (Signed)
   Patient comes in with mid sternal chest pain and SOB that started yesterday while patient was at work.  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  Patient states the pain goes to her left shoulder.  No diaphoresis, nausea, vomiting, or back pain.  Pain 7/10, sharp/pressure.

## 2021-07-25 NOTE — ED Provider Notes (Signed)
[facility]  CSN: [id number] Arrival date & time: 07/25/21 2204  Chief Complaint(s) Chest Pain  HPI Claire Chase is a 25 y.o. female with a past medical history listed below who presents to the emergency department with approximately 2 weeks of intermittent sharp anterior chest pain.  Pain was exacerbated with certain positions especially lying back on as well as movement of her torso.  She was evaluated last week for the same and felt to be related to anxiety.  She returns today for new left-sided chest pressure that began yesterday and has been persistent since onset.  This again is exacerbated with certain positions more so range of motion of the left shoulder and upper extremity.  Improved with certain positions.   She did admit to going axe throwing 1-2 days prior to symptom onset.  Additionally she reports that at her job she does a lot of lifting.  She denies any falls or trauma. No recent fevers or infections.  No coughing or congestion.  No nausea or vomiting.  No abdominal pain.  No other physical complaints.  The history is provided by the patient.   Past Medical History Past Medical History:  Diagnosis Date   ADHD (attention deficit hyperactivity disorder)    Anxiety    Diabetes mellitus without complication (HCC)    There are no problems to display for this patient.  Home Medication(s) Prior to Admission medications   Medication Sig Start Date End Date Taking? Authorizing Provider  cephALEXin (KEFLEX) 500 MG capsule Take 1 capsule (500 mg total) by mouth 2 (two) times daily. 12/08/18   Rolan BuccoBelfi, Melanie, MD  dicyclomine (BENTYL) 20 MG tablet Take 1 tablet (20 mg total) by mouth 2 (two) times daily as needed (Abdominal cramping). 12/07/18   Ward, Chase PicketJaime Pilcher, PA-C  ibuprofen (ADVIL,MOTRIN) 200 MG tablet Take 800 mg by mouth every 6 (six) hours as needed for headache or moderate pain.    [provider]  naproxen (NAPROSYN) 500  MG tablet Take 1 tablet (500 mg total) by mouth 2 (two) times daily. Patient not taking: Reported on 12/07/2018 11/06/14   Derwood KaplanNanavati, Ankit, MD  ondansetron (ZOFRAN ODT) 4 MG disintegrating tablet Take 1 tablet (4 mg total) by mouth every 8 (eight) hours as needed for nausea or vomiting. 12/07/18   Ward, Chase PicketJaime Pilcher, PA-C                                                                                                                                    Past Surgical History History reviewed. No pertinent surgical history. Family History History reviewed. No pertinent family history.  Social History Social History   Tobacco Use   Smoking status: Never   Smokeless tobacco: Never  Vaping Use   Vaping Use: Never used  Substance Use Topics   Alcohol use: Yes    Comment: occasionally   Drug use: No   Allergies  Patient has no known allergies.  Review of Systems Review of Systems All other systems are reviewed and are negative for acute change except as noted in the HPI  Physical Exam Vital Signs  I have reviewed the triage vital signs BP 121/79 (BP Location: Right Arm)   Pulse 73   Temp 98.5 F (36.9 C) (Oral)   Resp 18   Ht 5\' 8"  (1.727 m)   Wt 136.1 kg   LMP 07/11/2021 (Approximate)   SpO2 100%   BMI 45.61 kg/m   Physical Exam Vitals reviewed.  Constitutional:      General: She is not in acute distress.    Appearance: She is well-developed. She is obese. She is not diaphoretic.  HENT:     Head: Normocephalic and atraumatic.     Nose: Nose normal.  Eyes:     General: No scleral icterus.       Right eye: No discharge.        Left eye: No discharge.     Conjunctiva/sclera: Conjunctivae normal.     Pupils: Pupils are equal, round, and reactive to light.  Cardiovascular:     Rate and Rhythm: Normal rate and regular rhythm.     Heart sounds: No murmur heard.   No friction rub. No gallop.  Pulmonary:     Effort: Pulmonary effort is normal. No respiratory distress.      Breath sounds: Normal breath sounds. No stridor. No rales.  Chest:     Chest wall: Tenderness present.    Abdominal:     General: There is no distension.     Palpations: Abdomen is soft.     Tenderness: There is no abdominal tenderness.  Musculoskeletal:        General: No tenderness.     Cervical back: Normal range of motion and neck supple.  Skin:    General: Skin is warm and dry.     Findings: No erythema or rash.  Neurological:     Mental Status: She is alert and oriented to person, place, and time.    ED Results and Treatments Labs (all labs ordered are listed, but only abnormal results are displayed) Labs Reviewed  BASIC METABOLIC PANEL  CBC  PREGNANCY, URINE  TROPONIN I (HIGH SENSITIVITY)                                                                                                                         EKG  EKG Interpretation  Date/Time:  Monday July 25 2021 22:16:17 EDT Ventricular Rate:  75 PR Interval:  154 QRS Duration: 90 QT Interval:  372 QTC Calculation: 415 R Axis:   54 Text Interpretation: Normal sinus rhythm with sinus arrhythmia Cannot rule out Anterior infarct , age undetermined Abnormal ECG No significant change since prior 8/22 Confirmed by 9/22 [phone]) on 07/25/2021 10:17:51 PM       Radiology DG Chest 2 View  Result Date: 07/25/2021 CLINICAL DATA:  Chest pain EXAM: CHEST -  2 VIEW COMPARISON:  07/19/2021 FINDINGS: Heart and mediastinal contours are within normal limits. No focal opacities or effusions. No acute bony abnormality. IMPRESSION: No active cardiopulmonary disease. Electronically Signed   By: Charlett Nose M.D.   On: 07/25/2021 22:45    Pertinent labs & imaging results that were available during my care of the patient were reviewed by me and considered in my medical decision making (see MDM for details).  Medications Ordered in ED Medications  ketorolac (TORADOL) 15 MG/ML injection 15 mg (15 mg Intravenous Given 07/25/21  2346)                                                                                                                                     Procedures Procedures  (including critical care time)  Medical Decision Making / ED Course I have reviewed the nursing notes for this encounter and the patient's prior records (if available in EHR or on provided paperwork).  Claire Chase was evaluated in Emergency Department on 07/26/2021 for the symptoms described in the history of present illness. She was evaluated in the context of the global COVID-19 pandemic, which necessitated consideration that the patient might be at risk for infection with the SARS-CoV-2 virus that causes COVID-19. Institutional protocols and algorithms that pertain to the evaluation of patients at risk for COVID-19 are in a state of rapid change based on information released by regulatory bodies including the CDC and federal and state organizations. These policies and algorithms were followed during the patient's care in the ED.     Highly atypical chest pain inconsistent with ACS. EKG without acute ischemic changes or evidence of pericarditis. Troponin more than 24 hours after symptom onset negative.  Feel this is sufficient to rule out ACS in this clinical setting.  Presentation not classic for aortic dissection or esophageal perforation. Chest x-ray without evidence suggestive of pneumonia, pneumothorax, pneumomediastinum.  No abnormal contour of the mediastinum to suggest dissection. No evidence of acute injuries.  Low pretest probability for pulmonary embolism and patient is PERC negative.   No leukocytosis or anemia on CBC.  Metabolic panel without significant electrolyte derangement or renal sufficiency.  Pertinent labs & imaging results that were available during my care of the patient were reviewed by me and considered in my medical decision making:  Most consistent with muscle strain/spasm of the chest wall  musculature.   Final Clinical Impression(s) / ED Diagnoses Final diagnoses:  Chest wall pain   The patient appears reasonably screened and/or stabilized for discharge and I doubt any other medical condition or other [facility] requiring further screening, evaluation, or treatment in the ED at this time prior to discharge. Safe for discharge with strict return precautions.  Disposition: Discharge  Condition: Good  I have discussed the results, Dx and Tx plan with the patient/family who expressed understanding and agree(s) with the plan. Discharge instructions discussed at length. The patient/family was given  strict return precautions who verbalized understanding of the instructions. No further questions at time of discharge.    ED Discharge Orders     None      Follow Up: Primary care provider  Call  to schedule an appointment for close follow up     This chart was dictated using voice recognition software.  Despite best efforts to proofread,  errors can occur which can change the documentation meaning.    Nira Conn, MD 07/26/21 [phone]

## 2021-07-26 LAB — PREGNANCY, URINE: Preg Test, Ur: NEGATIVE

## 2021-07-26 NOTE — Discharge Instructions (Addendum)
You may use over-the-counter Motrin (Ibuprofen), Acetaminophen (Tylenol), topical muscle creams such as SalonPas, Icy Hot, Bengay, etc. Please stretch, apply ice or heat (whichever helps), and have massage therapy for additional assistance.  

## 2022-06-03 IMAGING — DX DG CHEST 2V
2 series · 2 of 2 positions shown · non-contrast
Comparison: 07/19/2021

CLINICAL DATA: Chest pain

EXAM:
CHEST - 2 VIEW

[chest pa]
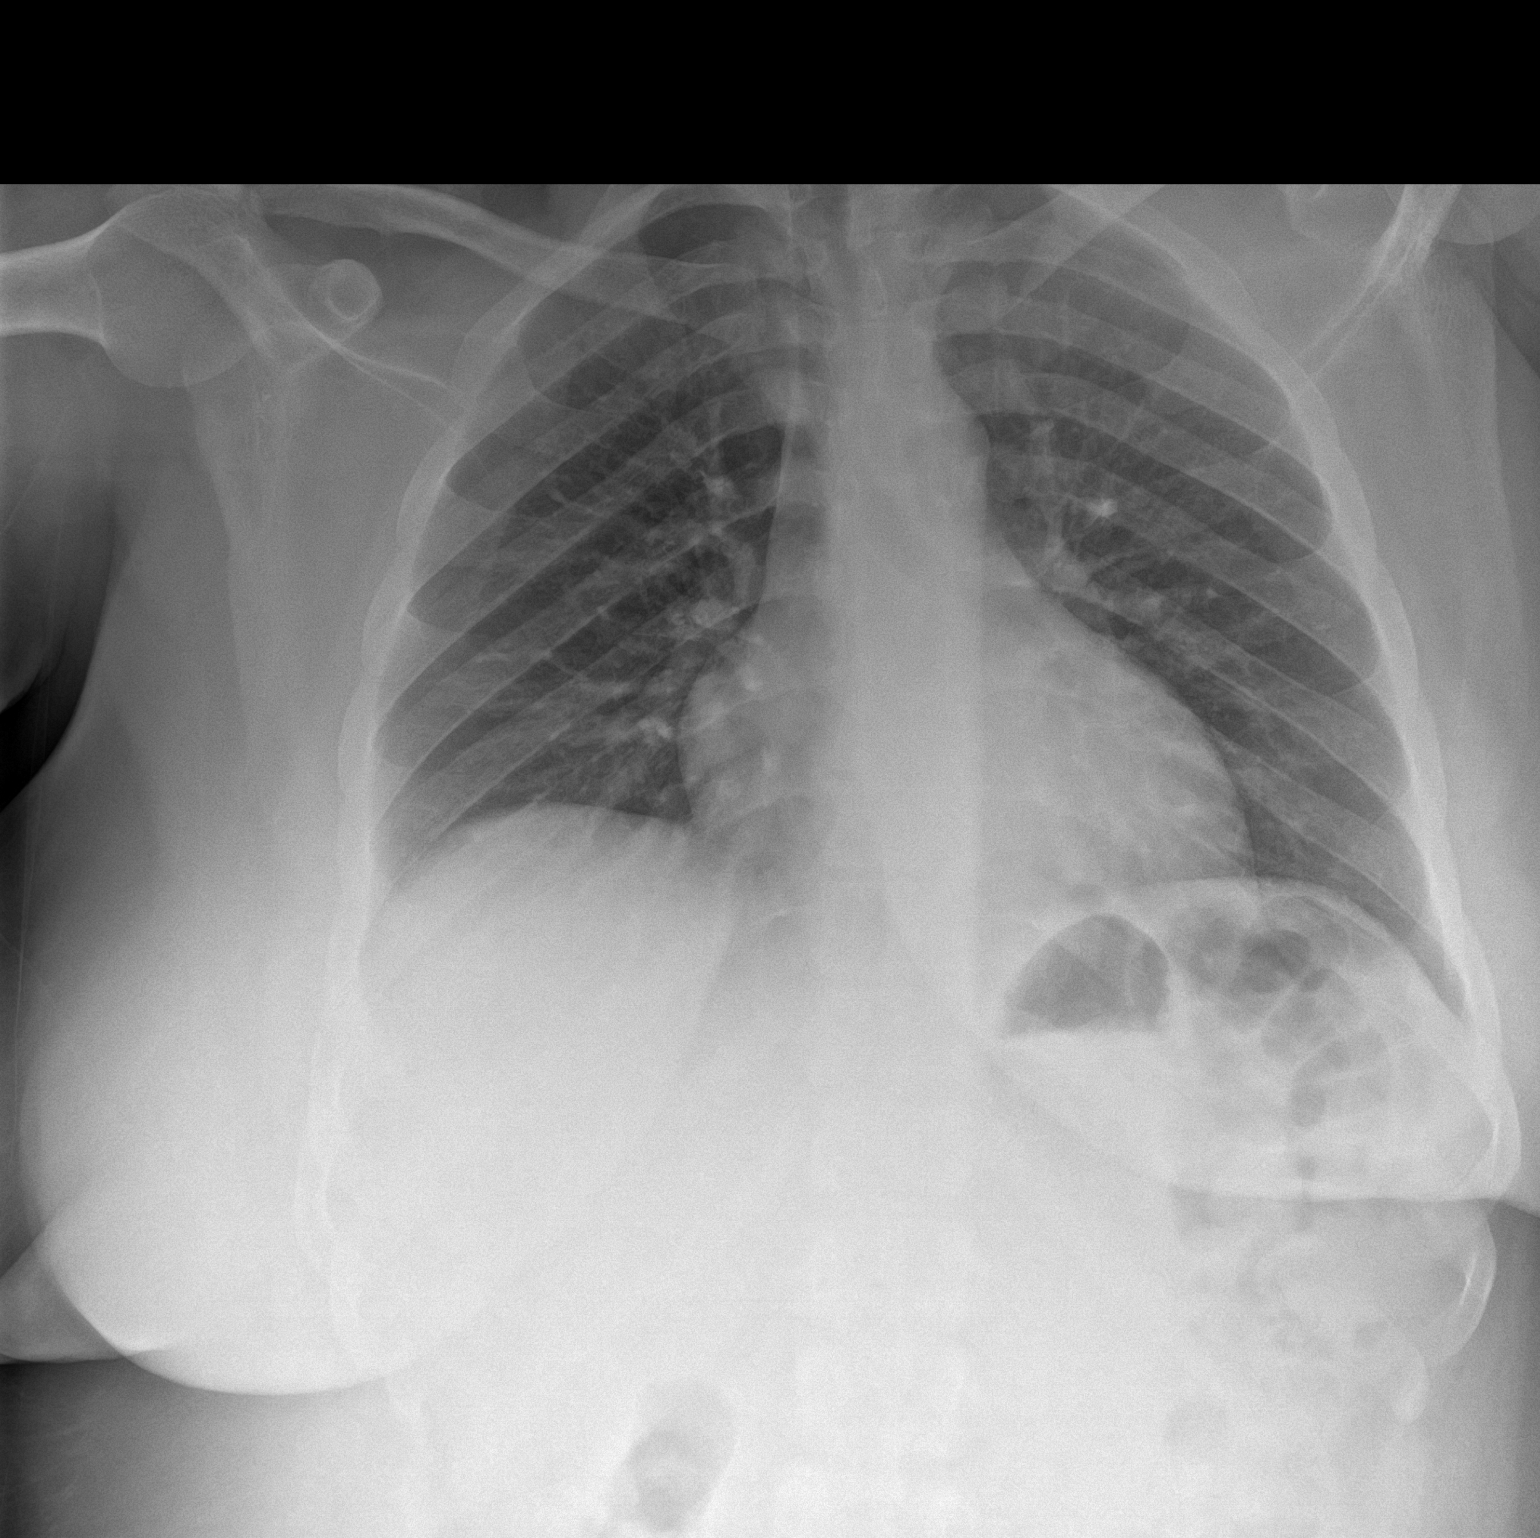

[chest lat]
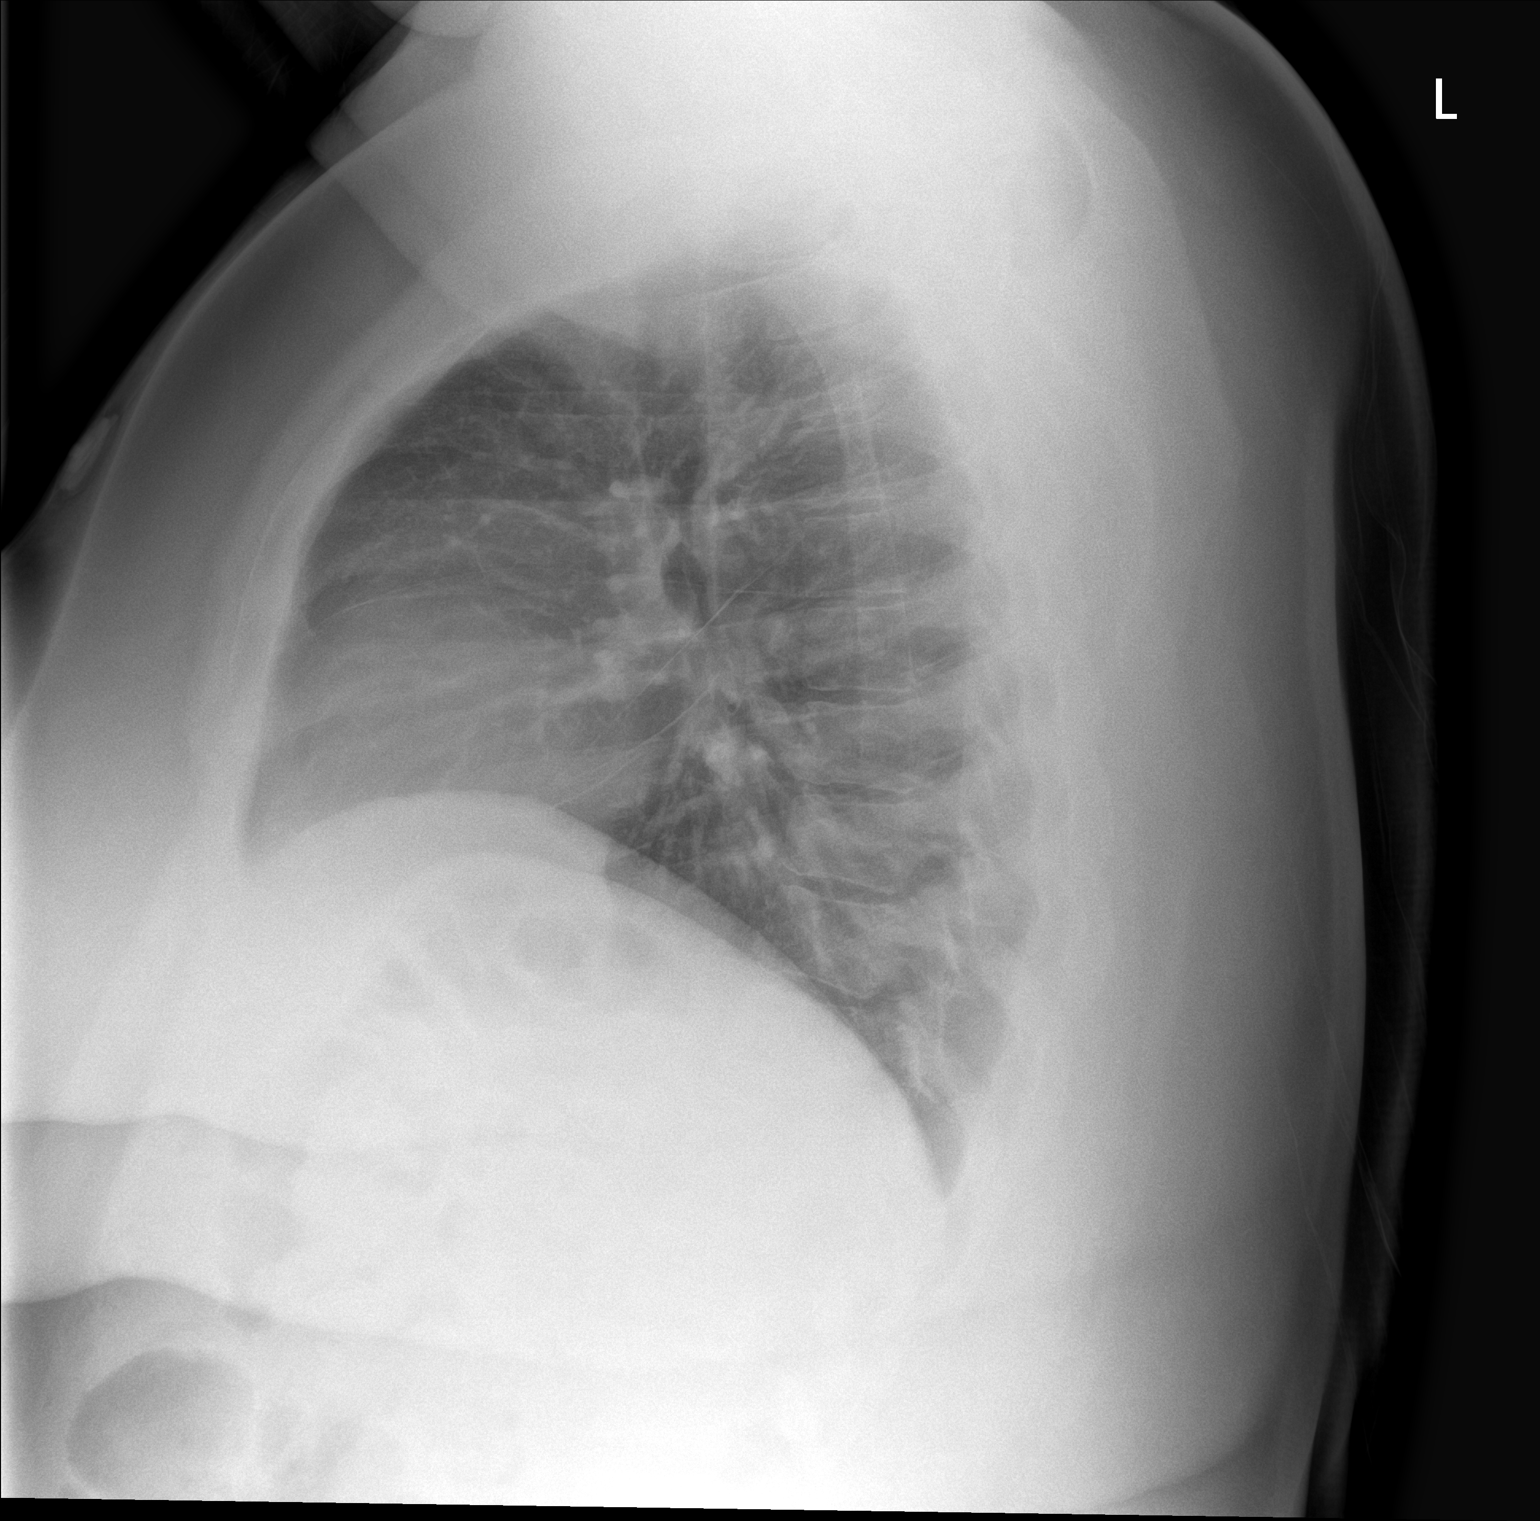

[2 of 2 positions shown; findings below may reference images not displayed]

FINDINGS: Heart and mediastinal contours are within normal limits. No focal
opacities or effusions. No acute bony abnormality.
IMPRESSION: No active cardiopulmonary disease.

## 2023-06-05 ENCOUNTER — Other Ambulatory Visit (HOSPITAL_COMMUNITY): Payer: Self-pay

## 2023-06-05 ENCOUNTER — Ambulatory Visit (INDEPENDENT_AMBULATORY_CARE_PROVIDER_SITE_OTHER): Payer: Self-pay | Admitting: Nurse Practitioner

## 2023-06-05 ENCOUNTER — Encounter: Payer: Self-pay | Admitting: Nurse Practitioner

## 2023-06-05 VITALS — BP 105/62 | HR 75 | Temp 97.4°F | Ht 68.0 in | Wt 255.4 lb

## 2023-06-05 DIAGNOSIS — F909 Attention-deficit hyperactivity disorder, unspecified type: Secondary | ICD-10-CM

## 2023-06-05 DIAGNOSIS — R7303 Prediabetes: Secondary | ICD-10-CM

## 2023-06-05 DIAGNOSIS — F329 Major depressive disorder, single episode, unspecified: Secondary | ICD-10-CM | POA: Insufficient documentation

## 2023-06-05 DIAGNOSIS — F5105 Insomnia due to other mental disorder: Secondary | ICD-10-CM | POA: Insufficient documentation

## 2023-06-05 DIAGNOSIS — Z0000 Encounter for general adult medical examination without abnormal findings: Secondary | ICD-10-CM

## 2023-06-05 DIAGNOSIS — E669 Obesity, unspecified: Secondary | ICD-10-CM

## 2023-06-05 DIAGNOSIS — F322 Major depressive disorder, single episode, severe without psychotic features: Secondary | ICD-10-CM

## 2023-06-05 DIAGNOSIS — F411 Generalized anxiety disorder: Secondary | ICD-10-CM

## 2023-06-05 MED ORDER — ESCITALOPRAM OXALATE 5 MG PO TABS
ORAL_TABLET | ORAL | 2 refills | Status: DC
  Filled 2023-06-05: qty 90, 50d supply, fill #0

## 2023-06-05 MED ORDER — TRAZODONE HCL 100 MG PO TABS
100.0000 mg | ORAL_TABLET | Freq: Every day | ORAL | 1 refills | Status: DC
  Filled 2023-06-05: qty 90, 90d supply, fill #0
  Filled 2023-09-17: qty 90, 90d supply, fill #1

## 2023-06-05 NOTE — Assessment & Plan Note (Signed)
Flowsheet Row Office Visit from 06/05/2023 in [facility]  PHQ-9 Total Score 16      - traZODone (DESYREL) 100 MG tablet; Take 1 tablet (100 mg) by mouth at bedtime.  Dispense: 90 tablet; Refill: 1 - escitalopram (LEXAPRO) 5 MG tablet; Take 1 tablet (5mg ) by mouth once daily for 1 week. Then increase to 2 tablets (10mg ) daily.  Dispense: 90 tablet; Refill: 2 - Ambulatory referral to Psychiatry

## 2023-06-05 NOTE — Assessment & Plan Note (Signed)
-   traZODone (DESYREL) 100 MG tablet; Take 1 tablet (100 mg) by mouth at bedtime.  Dispense: 90 tablet; Refill: 1

## 2023-06-05 NOTE — Progress Notes (Signed)
New Patient Office Visit  Subjective:  Patient ID: Claire Chase, female    DOB: [birth year]  Age: 27 y.o. MRN: [id number]  CC:  Chief Complaint  Patient presents with   Establish Care    Manage medication.    HPI Claire Chase is a 27 y.o. female  has a past medical history of ADHD (attention deficit hyperactivity disorder), Anxiety, Depression, Insomnia due to mental disorder, Obesity, and Prediabetes.obesity.  Patient presents to establish care for her chronic medical conditions, previous patient was at [facility], last visit was in 2021 l  Anxiety and depression.  Patient noted that she had had anxiety all her life was very anxious as a child.  Stated that her depression is due to life circumstances.  Was previously on Lexapro 10 mg daily, trazodone 100 mg daily.  Both medications with helping her, states that she gets some trazodone from home ordered which she has been taking.  She denies SI, HI.  Tried counseling in the past month counseling did not help  She also reports past medical history of ADHD.  Cannot remember what medication she was taking she reports trouble with concentration and getting tasks done  Obesity.  She walks 15 minutes to out for a walk daily,(about 30 minutes total) plans on starting exercises soon.  Reports that her diet can be better.  She stated that she has been able to lose some weight with the walking exercises when she was diagnosed with prediabetes  .  Past Medical History:  Diagnosis Date   ADHD (attention deficit hyperactivity disorder)    Anxiety    Depression    Insomnia due to mental disorder    Obesity    Prediabetes       Take one a day Reorder 04/28/2020 10/05/2020   Take 1 tab at night       History reviewed. No pertinent surgical history.  Family History  Problem Relation Age of Onset   Diabetes Mother    Hypertension Mother    Depression Mother    Diabetes Father    Breast cancer Maternal Aunt    Diabetes Paternal  Grandfather     Social History   Socioeconomic History   Marital status: Single    Spouse name: Not on file   Number of children: Not on file   Years of education: Not on file   Highest education level: Not on file  Occupational History   Not on file  Tobacco Use   Smoking status: Never   Smokeless tobacco: Never  Vaping Use   Vaping Use: Never used  Substance and Sexual Activity   Alcohol use: Yes    Comment: occasionally   Drug use: No   Sexual activity: Yes  Other Topics Concern   Not on file  Social History Narrative   Lives with her family    Social Determinants of Health   Financial Resource Strain: Not on file  Food Insecurity: No Food Insecurity (06/05/2023)   Hunger Vital Sign    Worried About Running Out of Food in the Last Year: Never true    Ran Out of Food in the Last Year: Never true  Transportation Needs: No Transportation Needs (06/05/2023)   PRAPARE - Administrator, Civil Service (Medical): No    Lack of Transportation (Non-Medical): No  Physical Activity: Not on file  Stress: Not on file  Social Connections: Not on file  Intimate Partner Violence: Not At Risk (06/05/2023)  Humiliation, Afraid, Rape, and Kick questionnaire    Fear of Current or Ex-Partner: No    Emotionally Abused: No    Physically Abused: No    Sexually Abused: No    ROS Review of Systems  Constitutional:  Negative for activity change, appetite change, chills, fatigue and fever.  HENT:  Negative for congestion, dental problem, ear discharge, ear pain, hearing loss, rhinorrhea, sinus pressure, sinus pain, sneezing and sore throat.   Eyes:  Negative for pain, discharge, redness and itching.  Respiratory:  Negative for cough, chest tightness, shortness of breath and wheezing.   Cardiovascular:  Negative for chest pain, palpitations and leg swelling.  Gastrointestinal:  Negative for abdominal distention, abdominal pain, anal bleeding, blood in stool, constipation,  diarrhea, nausea, rectal pain and vomiting.  Endocrine: Negative for cold intolerance, heat intolerance, polydipsia, polyphagia and polyuria.  Genitourinary:  Negative for difficulty urinating, dysuria, flank pain, frequency, hematuria, menstrual problem, pelvic pain and vaginal bleeding.  Musculoskeletal:  Negative for arthralgias, back pain, gait problem, joint swelling and myalgias.  Skin:  Negative for color change, pallor, rash and wound.  Allergic/Immunologic: Negative for environmental allergies, food allergies and immunocompromised state.  Neurological:  Negative for dizziness, tremors, facial asymmetry, weakness and headaches.  Hematological:  Negative for adenopathy. Does not bruise/bleed easily.  Psychiatric/Behavioral:  Negative for agitation, behavioral problems, confusion, decreased concentration, hallucinations, self-injury and suicidal ideas.     Objective:   Today's Vitals: BP 105/62   Pulse 75   Temp (!) 97.4 F (36.3 C)   Ht 5\' 8"  (1.727 m)   Wt 255 lb 6.4 oz (115.8 kg)   LMP 05/25/2023 (Approximate)   SpO2 100%   BMI 38.83 kg/m   Physical Exam Vitals and nursing note reviewed.  Constitutional:      General: She is not in acute distress.    Appearance: Normal appearance. She is obese. She is not ill-appearing, toxic-appearing or diaphoretic.  HENT:     Mouth/Throat:     Mouth: Mucous membranes are moist.     Pharynx: Oropharynx is clear. No oropharyngeal exudate or posterior oropharyngeal erythema.  Eyes:     General: No scleral icterus.       Right eye: No discharge.        Left eye: No discharge.     Extraocular Movements: Extraocular movements intact.     Conjunctiva/sclera: Conjunctivae normal.  Cardiovascular:     Rate and Rhythm: Normal rate and regular rhythm.     Pulses: Normal pulses.     Heart sounds: Normal heart sounds. No murmur heard.    No friction rub. No gallop.  Pulmonary:     Effort: Pulmonary effort is normal. No respiratory  distress.     Breath sounds: Normal breath sounds. No stridor. No wheezing, rhonchi or rales.  Chest:     Chest wall: No tenderness.  Abdominal:     General: There is no distension.     Palpations: Abdomen is soft.     Tenderness: There is no abdominal tenderness. There is no right CVA tenderness, left CVA tenderness or guarding.  Musculoskeletal:        General: No swelling, tenderness, deformity or signs of injury.     Right lower leg: No edema.     Left lower leg: No edema.  Skin:    General: Skin is warm and dry.     Capillary Refill: Capillary refill takes 2 to 3 seconds.     Coloration: Skin is not jaundiced  or pale.     Findings: No bruising, erythema or lesion.  Neurological:     Mental Status: She is alert and oriented to person, place, and time.     Motor: No weakness.     Coordination: Coordination normal.     Gait: Gait normal.  Psychiatric:        Mood and Affect: Mood normal.        Behavior: Behavior normal.        Thought Content: Thought content normal.        Judgment: Judgment normal.     Assessment & Plan:   Problem List Items Addressed This Visit       Other   Prediabetes   Class 3 severe obesity in adult [facility])    Wt Readings from Last 3 Encounters:  06/05/23 255 lb 6.4 oz (115.8 kg)  07/25/21 300 lb (136.1 kg)  07/19/21 300 lb (136.1 kg)   Body mass index is 38.83 kg/m.  Patient counseled on low-carb modified diet Encouraged to engage in regular moderate to vigorous exercises at least 150 minutes weekly Benefits of healthy weights discussed       GAD (generalized anxiety disorder)       06/05/2023   10:11 AM  GAD 7 : Generalized Anxiety Score  Nervous, Anxious, on Edge 3  Control/stop worrying 3  Worry too much - different things 3  Trouble relaxing 2  Restless 2  Easily annoyed or irritable 3  Afraid - awful might happen 0  Total GAD 7 Score 16   - escitalopram (LEXAPRO) 5 MG tablet; Take 1 tablet (5mg ) by mouth once daily for 1  week. Then increase to 2 tablets (10mg ) daily.  Dispense: 90 tablet; Refill: 2 - Ambulatory referral to Psychiatry  She denies SI, HI      Relevant Medications   traZODone (DESYREL) 100 MG tablet   escitalopram (LEXAPRO) 5 MG tablet   Other Relevant Orders   Ambulatory referral to Psychiatry   Major depressive disorder    Flowsheet Row Office Visit from 06/05/2023 in [facility]  PHQ-9 Total Score 16      - traZODone (DESYREL) 100 MG tablet; Take 1 tablet (100 mg) by mouth at bedtime.  Dispense: 90 tablet; Refill: 1 - escitalopram (LEXAPRO) 5 MG tablet; Take 1 tablet (5mg ) by mouth once daily for 1 week. Then increase to 2 tablets (10mg ) daily.  Dispense: 90 tablet; Refill: 2 - Ambulatory referral to Psychiatry       Relevant Medications   traZODone (DESYREL) 100 MG tablet   escitalopram (LEXAPRO) 5 MG tablet   Other Relevant Orders   Ambulatory referral to Psychiatry   Insomnia due to mental disorder    - traZODone (DESYREL) 100 MG tablet; Take 1 tablet (100 mg) by mouth at bedtime.  Dispense: 90 tablet; Refill: 1      Relevant Medications   escitalopram (LEXAPRO) 5 MG tablet   Other Relevant Orders   Ambulatory referral to Psychiatry   Other Visit Diagnoses     Attention deficit hyperactivity disorder (ADHD), unspecified ADHD type    -  Primary   Relevant Orders   Ambulatory referral to Psychiatry   Health care maintenance       Relevant Orders   CBC   CMP14+EGFR   TSH       Outpatient Encounter Medications as of 06/05/2023  Medication Sig   escitalopram (LEXAPRO) 5 MG tablet Take 1 tablet (5mg ) by mouth once  daily for 1 week. Then increase to 2 tablets (10mg ) daily.   traZODone (DESYREL) 100 MG tablet Take 1 tablet (100 mg) by mouth at bedtime.   [DISCONTINUED] cephALEXin (KEFLEX) 500 MG capsule Take 1 capsule (500 mg total) by mouth 2 (two) times daily. (Patient not taking: Reported on 06/05/2023)   [DISCONTINUED] dicyclomine (BENTYL) 20 MG  tablet Take 1 tablet (20 mg total) by mouth 2 (two) times daily as needed (Abdominal cramping). (Patient not taking: Reported on 06/05/2023)   [DISCONTINUED] ibuprofen (ADVIL,MOTRIN) 200 MG tablet Take 800 mg by mouth every 6 (six) hours as needed for headache or moderate pain. (Patient not taking: Reported on 06/05/2023)   [DISCONTINUED] naproxen (NAPROSYN) 500 MG tablet Take 1 tablet (500 mg total) by mouth 2 (two) times daily. (Patient not taking: Reported on 12/07/2018)   [DISCONTINUED] ondansetron (ZOFRAN ODT) 4 MG disintegrating tablet Take 1 tablet (4 mg total) by mouth every 8 (eight) hours as needed for nausea or vomiting. (Patient not taking: Reported on 06/05/2023)   No facility-administered encounter medications on file as of 06/05/2023.    Follow-up: Return in about 2 months (around 08/05/2023) for ANXIETY, DEPRESSION.   Donell Beers, FNP

## 2023-06-05 NOTE — Assessment & Plan Note (Signed)
Wt Readings from Last 3 Encounters:  06/05/23 255 lb 6.4 oz (115.8 kg)  07/25/21 300 lb (136.1 kg)  07/19/21 300 lb (136.1 kg)   Body mass index is 38.83 kg/m.  Patient counseled on low-carb modified diet Encouraged to engage in regular moderate to vigorous exercises at least 150 minutes weekly Benefits of healthy weights discussed

## 2023-06-05 NOTE — Patient Instructions (Signed)
  GAD (generalized anxiety disorder)  - escitalopram (LEXAPRO) 5 MG tablet; Take lexapro 5mg  daily by  mouth for one week  increase to 10mg  daily afterwards  Dispense: 90 tablet; Refill: 2 - Ambulatory referral to Psychiatry   Current severe episode of major depressive disorder without psychotic features, unspecified whether recurrent (HCC) - traZODone (DESYREL) 100 MG tablet; Take 1 tablet (100 mg total) by mouth at bedtime.  Dispense: 90 tablet; Refill: 1 - escitalopram (LEXAPRO) 5 MG tablet; Take lexapro 5mg  daily by  mouth for one week  increase to 10mg  daily afterwards  Dispense: 90 tablet; Refill: 2 - Ambulatory referral to Psychiatry  . Insomnia due to mental disorder  - escitalopram (LEXAPRO) 5 MG tablet; Take lexapro 5mg  daily by  mouth for one week  increase to 10mg  daily afterwards  Dispense: 90 tablet; Refill: 2 - Ambulatory referral to Psychiatry  It is important that you exercise regularly at least 30 minutes 5 times a week as tolerated  Think about what you will eat, plan ahead. Choose " clean, green, fresh or frozen" over canned, processed or packaged foods which are more sugary, salty and fatty. 70 to 75% of food eaten should be vegetables and fruit. Three meals at set times with snacks allowed between meals, but they must be fruit or vegetables. Aim to eat over a 12 hour period , example 7 am to 7 pm, and STOP after  your last meal of the day. Drink water,generally about 64 ounces per day, no other drink is as healthy. Fruit juice is best enjoyed in a healthy way, by EATING the fruit.  [facility] for choosing [facility] we consider it a privelige to serve you.

## 2023-06-05 NOTE — Assessment & Plan Note (Signed)
    06/05/2023   10:11 AM  GAD 7 : Generalized Anxiety Score  Nervous, Anxious, on Edge 3  Control/stop worrying 3  Worry too much - different things 3  Trouble relaxing 2  Restless 2  Easily annoyed or irritable 3  Afraid - awful might happen 0  Total GAD 7 Score 16    - escitalopram (LEXAPRO) 5 MG tablet; Take 1 tablet (5mg ) by mouth once daily for 1 week. Then increase to 2 tablets (10mg ) daily.  Dispense: 90 tablet; Refill: 2 - Ambulatory referral to Psychiatry  She denies SI, HI

## 2023-06-06 LAB — CMP14+EGFR
ALT: 24 IU/L (ref 0–32)
AST: 29 IU/L (ref 0–40)
Albumin: 4.2 g/dL (ref 4.0–5.0)
Alkaline Phosphatase: 78 IU/L (ref 44–121)
BUN/Creatinine Ratio: 13 (ref 9–23)
BUN: 8 mg/dL (ref 6–20)
Bilirubin Total: 0.3 mg/dL (ref 0.0–1.2)
CO2: 24 mmol/L (ref 20–29)
Calcium: 9.4 mg/dL (ref 8.7–10.2)
Chloride: 103 mmol/L (ref 96–106)
Creatinine, Ser: 0.61 mg/dL (ref 0.57–1.00)
Globulin, Total: 2.7 g/dL (ref 1.5–4.5)
Glucose: 63 mg/dL — ABNORMAL LOW (ref 70–99)
Potassium: 5.2 mmol/L (ref 3.5–5.2)
Sodium: 140 mmol/L (ref 134–144)
Total Protein: 6.9 g/dL (ref 6.0–8.5)
eGFR: 126 mL/min/{1.73_m2} (ref >59)

## 2023-06-06 LAB — CBC
Hematocrit: 41.1 % (ref 34.0–46.6)
Hemoglobin: 13.7 g/dL (ref 11.1–15.9)
MCH: 30.4 pg (ref 26.6–33.0)
MCHC: 33.3 g/dL (ref 31.5–35.7)
MCV: 91 fL (ref 79–97)
Platelets: 296 10*3/uL (ref 150–450)
RBC: 4.51 x10E6/uL (ref 3.77–5.28)
RDW: 12 % (ref 11.7–15.4)
WBC: 6 10*3/uL (ref 3.4–10.8)

## 2023-06-06 LAB — TSH: TSH: 0.817 u[IU]/mL (ref 0.450–4.500)

## 2023-07-31 ENCOUNTER — Ambulatory Visit: Payer: Self-pay | Admitting: Nurse Practitioner

## 2023-09-17 ENCOUNTER — Other Ambulatory Visit (HOSPITAL_COMMUNITY): Payer: Self-pay

## 2023-09-18 ENCOUNTER — Other Ambulatory Visit (HOSPITAL_COMMUNITY): Payer: Self-pay

## 2023-09-28 ENCOUNTER — Other Ambulatory Visit (HOSPITAL_COMMUNITY): Payer: Self-pay

## 2024-03-07 ENCOUNTER — Encounter: Payer: Self-pay | Admitting: Nurse Practitioner

## 2024-03-07 ENCOUNTER — Other Ambulatory Visit: Payer: Self-pay

## 2024-03-07 ENCOUNTER — Ambulatory Visit: Payer: Self-pay | Admitting: Nurse Practitioner

## 2024-03-07 VITALS — BP 111/66 | HR 69 | Temp 97.3°F

## 2024-03-07 DIAGNOSIS — F411 Generalized anxiety disorder: Secondary | ICD-10-CM | POA: Diagnosis not present

## 2024-03-07 DIAGNOSIS — F322 Major depressive disorder, single episode, severe without psychotic features: Secondary | ICD-10-CM

## 2024-03-07 DIAGNOSIS — F5105 Insomnia due to other mental disorder: Secondary | ICD-10-CM | POA: Diagnosis not present

## 2024-03-07 DIAGNOSIS — Z30013 Encounter for initial prescription of injectable contraceptive: Secondary | ICD-10-CM | POA: Insufficient documentation

## 2024-03-07 DIAGNOSIS — F909 Attention-deficit hyperactivity disorder, unspecified type: Secondary | ICD-10-CM | POA: Diagnosis not present

## 2024-03-07 MED ORDER — ESCITALOPRAM OXALATE 10 MG PO TABS
10.0000 mg | ORAL_TABLET | Freq: Every day | ORAL | 1 refills | Status: AC
  Filled 2024-03-07: qty 30, 30d supply, fill #0

## 2024-03-07 MED ORDER — TRAZODONE HCL 100 MG PO TABS
100.0000 mg | ORAL_TABLET | Freq: Every evening | ORAL | 1 refills | Status: AC | PRN
  Filled 2024-03-07: qty 30, 30d supply, fill #0

## 2024-03-07 NOTE — Assessment & Plan Note (Signed)
-   Ambulatory referral to Psychiatry

## 2024-03-07 NOTE — Patient Instructions (Addendum)

## 2024-03-07 NOTE — Assessment & Plan Note (Addendum)
 Flowsheet Row Office Visit from 03/07/2024 in [facility]  PHQ-9 Total Score 6     Restart Lexapro 10 mg daily Takes trazodone 100 mg at bedtime as needed Patient denies SI, HI

## 2024-03-07 NOTE — Assessment & Plan Note (Signed)
 Trazodone 100 mg at bedtime as needed refilled

## 2024-03-07 NOTE — Progress Notes (Signed)
 Acute Office Visit  Subjective:     Patient ID: Claire Chase, female    DOB: Feb 19, 1996, 28 y.o.   MRN: [id number]  Chief Complaint  Patient presents with   ADHD    HPI Ms Claire Chase  has a past medical history of ADHD (attention deficit hyperactivity disorder), Anxiety, Depression, Insomnia due to mental disorder, Obesity, and Prediabetes.  Patient presents for medication refills  Anxiety and depression.  Was on Lexapro 10 mg daily, but stated that she stopped taking the medication when she ran out and did not get a refill, states that she has been taking trazodone 100 mg at bedtime as needed for insomnia.  She would like to restart Lexapro.  She denies SI, HI  ADHD.  Stated that she was on medications for ADHD when she was much younger, she would like to restart medications, she was referred to psychiatrist at her last visit but she has not been seen.  Patient stated that she would like to get back on a birth control, she is interested in Depo-Provera injection ,she was provided information for Depo-Provera injection, plans to go through the information provided and make a decision if she wants to start to medication.   Review of Systems  Constitutional:  Negative for activity change, appetite change, chills, fatigue and fever.  HENT:  Negative for congestion, dental problem, ear discharge, ear pain and hearing loss.   Respiratory:  Negative for cough, chest tightness, shortness of breath and wheezing.   Cardiovascular:  Negative for chest pain, palpitations and leg swelling.  Gastrointestinal:  Negative for abdominal distention, abdominal pain and anal bleeding.  Genitourinary:  Negative for flank pain, frequency, hematuria, menstrual problem, pelvic pain and vaginal bleeding.  Musculoskeletal:  Negative for arthralgias, back pain and gait problem.  Skin:  Negative for rash and wound.  Neurological:  Negative for dizziness, tremors, facial asymmetry, weakness and headaches.   Psychiatric/Behavioral:  Negative for agitation, behavioral problems, confusion, decreased concentration, hallucinations, self-injury and suicidal ideas.         Objective:    BP 111/66   Pulse 69   Temp (!) 97.3 F (36.3 C)   SpO2 100%    Physical Exam Vitals and nursing note reviewed.  Constitutional:      General: She is not in acute distress.    Appearance: Normal appearance. She is obese. She is not ill-appearing, toxic-appearing or diaphoretic.  HENT:     Mouth/Throat:     Mouth: Mucous membranes are moist.     Pharynx: Oropharynx is clear. No oropharyngeal exudate or posterior oropharyngeal erythema.  Eyes:     General: No scleral icterus.       Right eye: No discharge.        Left eye: No discharge.     Extraocular Movements: Extraocular movements intact.     Conjunctiva/sclera: Conjunctivae normal.  Cardiovascular:     Rate and Rhythm: Normal rate and regular rhythm.     Pulses: Normal pulses.     Heart sounds: Normal heart sounds. No murmur heard.    No friction rub. No gallop.  Pulmonary:     Effort: Pulmonary effort is normal. No respiratory distress.     Breath sounds: Normal breath sounds. No stridor. No wheezing, rhonchi or rales.  Chest:     Chest wall: No tenderness.  Abdominal:     General: There is no distension.     Palpations: Abdomen is soft.     Tenderness: There is no abdominal  tenderness. There is no right CVA tenderness, left CVA tenderness or guarding.  Musculoskeletal:        General: No swelling, tenderness, deformity or signs of injury.     Right lower leg: No edema.     Left lower leg: No edema.  Skin:    General: Skin is warm and dry.     Capillary Refill: Capillary refill takes less than 2 seconds.     Coloration: Skin is not jaundiced or pale.     Findings: No bruising, erythema or lesion.  Neurological:     Mental Status: She is alert and oriented to person, place, and time.     Motor: No weakness.     Coordination:  Coordination normal.     Gait: Gait normal.  Psychiatric:        Mood and Affect: Mood normal.        Behavior: Behavior normal.        Thought Content: Thought content normal.        Judgment: Judgment normal.     No results found for any visits on 03/07/24.      Assessment & Plan:   Problem List Items Addressed This Visit       Other   GAD (generalized anxiety disorder)      03/07/2024   11:47 AM 06/05/2023   10:11 AM  GAD 7 : Generalized Anxiety Score  Nervous, Anxious, on Edge 3 3  Control/stop worrying 3 3  Worry too much - different things 3 3  Trouble relaxing 3 2  Restless 1 2  Easily annoyed or irritable 3 3  Afraid - awful might happen 0 0  Total GAD 7 Score 16 16  Anxiety Difficulty Very difficult   Restart Lexapro 10 mg daily Follow-up in 2 months        Relevant Medications   escitalopram (LEXAPRO) 10 MG tablet   traZODone (DESYREL) 100 MG tablet   Major depressive disorder   Flowsheet Row Office Visit from 03/07/2024 in [facility]  PHQ-9 Total Score 6     Restart Lexapro 10 mg daily Takes trazodone 100 mg at bedtime as needed Patient denies SI, HI      Relevant Medications   escitalopram (LEXAPRO) 10 MG tablet   traZODone (DESYREL) 100 MG tablet   Other Relevant Orders   Ambulatory referral to Psychiatry   Insomnia due to mental disorder - Primary   Trazodone 100 mg at bedtime as needed refilled      Relevant Medications   traZODone (DESYREL) 100 MG tablet   Attention deficit hyperactivity disorder (ADHD)    - Ambulatory referral to Psychiatry        Relevant Orders   Ambulatory referral to Psychiatry    Meds ordered this encounter  Medications   escitalopram (LEXAPRO) 10 MG tablet    Sig: Take 1 tablet (10 mg total) by mouth daily.    Dispense:  90 tablet    Refill:  1   traZODone (DESYREL) 100 MG tablet    Sig: Take 1 tablet (100 mg total) by mouth at bedtime as  needed for sleep.    Dispense:  90 tablet    Refill:  1    Return in about 2 months (around 05/07/2024) for DEPRESSION, ANXIETY, CPE.  Donell Beers, FNP

## 2024-03-07 NOTE — Assessment & Plan Note (Addendum)
    03/07/2024   11:47 AM 06/05/2023   10:11 AM  GAD 7 : Generalized Anxiety Score  Nervous, Anxious, on Edge 3 3  Control/stop worrying 3 3  Worry too much - different things 3 3  Trouble relaxing 3 2  Restless 1 2  Easily annoyed or irritable 3 3  Afraid - awful might happen 0 0  Total GAD 7 Score 16 16  Anxiety Difficulty Very difficult   Restart Lexapro 10 mg daily Follow-up in 2 months

## 2024-03-18 ENCOUNTER — Other Ambulatory Visit: Payer: Self-pay

## 2024-05-02 ENCOUNTER — Ambulatory Visit: Payer: Self-pay | Admitting: Nurse Practitioner
